# Patient Record
Sex: Female | Born: 2005 | Race: White | Hispanic: No | Marital: Single
Health system: Southern US, Community
[De-identification: ages and names within clinical notes are randomized; demographics above are authoritative.]

## PROBLEM LIST (undated history)

## (undated) ENCOUNTER — Emergency Department (HOSPITAL_BASED_OUTPATIENT_CLINIC_OR_DEPARTMENT_OTHER): Admission: EM | Payer: Medicaid Other | Source: Home / Self Care

## (undated) DIAGNOSIS — F329 Major depressive disorder, single episode, unspecified: Secondary | ICD-10-CM

## (undated) DIAGNOSIS — Q249 Congenital malformation of heart, unspecified: Secondary | ICD-10-CM

## (undated) DIAGNOSIS — F32A Depression, unspecified: Secondary | ICD-10-CM

## (undated) DIAGNOSIS — F419 Anxiety disorder, unspecified: Secondary | ICD-10-CM

## (undated) DIAGNOSIS — G43909 Migraine, unspecified, not intractable, without status migrainosus: Secondary | ICD-10-CM

## (undated) HISTORY — PX: CARDIAC SURGERY: SHX584

## (undated) HISTORY — PX: BREAST SURGERY: SHX581

---
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
## 2023-01-27 ENCOUNTER — Encounter (HOSPITAL_BASED_OUTPATIENT_CLINIC_OR_DEPARTMENT_OTHER): Payer: Self-pay | Admitting: Emergency Medicine

## 2023-01-27 ENCOUNTER — Emergency Department (HOSPITAL_BASED_OUTPATIENT_CLINIC_OR_DEPARTMENT_OTHER): Payer: Medicaid Other

## 2023-01-27 ENCOUNTER — Emergency Department (HOSPITAL_BASED_OUTPATIENT_CLINIC_OR_DEPARTMENT_OTHER)
Admission: EM | Admit: 2023-01-27 | Discharge: 2023-01-27 | Disposition: A | Discharge status: Home / Self Care | Payer: Medicaid Other | Attending: Emergency Medicine | Admitting: Emergency Medicine

## 2023-01-27 DIAGNOSIS — R7309 Other abnormal glucose: Secondary | ICD-10-CM | POA: Diagnosis not present

## 2023-01-27 DIAGNOSIS — R519 Headache, unspecified: Secondary | ICD-10-CM | POA: Diagnosis present

## 2023-01-27 DIAGNOSIS — R112 Nausea with vomiting, unspecified: Secondary | ICD-10-CM | POA: Insufficient documentation

## 2023-01-27 LAB — COMPREHENSIVE METABOLIC PANEL
ALT: 24 U/L (ref 0–44)
AST: 35 U/L (ref 15–41)
Albumin: 4.4 g/dL (ref 3.5–5.0)
Alkaline Phosphatase: 80 U/L (ref 38–126)
Anion gap: 11 (ref 5–15)
BUN: 8 mg/dL (ref 6–20)
CO2: 24 mmol/L (ref 22–32)
Calcium: 9.2 mg/dL (ref 8.9–10.3)
Chloride: 103 mmol/L (ref 98–111)
Creatinine, Ser: 0.66 mg/dL (ref 0.44–1.00)
GFR, Estimated: >60 mL/min (ref >60)
Glucose, Bld: 97 mg/dL (ref 70–99)
Potassium: 3.2 mmol/L — ABNORMAL LOW (ref 3.5–5.1)
Sodium: 138 mmol/L (ref 135–145)
Total Bilirubin: 0.6 mg/dL (ref 0.3–1.2)
Total Protein: 7.7 g/dL (ref 6.5–8.1)

## 2023-01-27 LAB — CBC WITH DIFFERENTIAL/PLATELET
Abs Immature Granulocytes: 0.03 10*3/uL (ref 0.00–0.07)
Basophils Absolute: 0 10*3/uL (ref 0.0–0.1)
Basophils Relative: 0 %
Eosinophils Absolute: 0.1 10*3/uL (ref 0.0–0.5)
Eosinophils Relative: 1 %
HCT: 36.4 % (ref 36.0–46.0)
Hemoglobin: 12.2 g/dL (ref 12.0–15.0)
Immature Granulocytes: 0 %
Lymphocytes Relative: 20 %
Lymphs Abs: 2.1 10*3/uL (ref 0.7–4.0)
MCH: 27.6 pg (ref 26.0–34.0)
MCHC: 33.5 g/dL (ref 30.0–36.0)
MCV: 82.4 fL (ref 80.0–100.0)
Monocytes Absolute: 0.5 10*3/uL (ref 0.1–1.0)
Monocytes Relative: 5 %
Neutro Abs: 7.6 10*3/uL (ref 1.7–7.7)
Neutrophils Relative %: 74 %
Platelets: 341 10*3/uL (ref 150–400)
RBC: 4.42 MIL/uL (ref 3.87–5.11)
RDW: 12.9 % (ref 11.5–15.5)
WBC: 10.3 10*3/uL (ref 4.0–10.5)
nRBC: 0 % (ref 0.0–0.2)

## 2023-01-27 LAB — CBG MONITORING, ED: Glucose-Capillary: 85 mg/dL (ref 70–99)

## 2023-01-27 LAB — PREGNANCY, URINE: Preg Test, Ur: NEGATIVE

## 2023-01-27 MED ORDER — KETOROLAC TROMETHAMINE 15 MG/ML IJ SOLN
15.0000 mg | Freq: Once | INTRAMUSCULAR | Status: AC
  Administered 2023-01-27: 15 mg via INTRAVENOUS
  Filled 2023-01-27: qty 1

## 2023-01-27 MED ORDER — POTASSIUM CHLORIDE CRYS ER 20 MEQ PO TBCR
40.0000 meq | EXTENDED_RELEASE_TABLET | Freq: Once | ORAL | Status: AC
  Administered 2023-01-27: 40 meq via ORAL
  Filled 2023-01-27: qty 2

## 2023-01-27 MED ORDER — PROCHLORPERAZINE EDISYLATE 10 MG/2ML IJ SOLN
10.0000 mg | Freq: Once | INTRAMUSCULAR | Status: AC
  Administered 2023-01-27: 10 mg via INTRAVENOUS
  Filled 2023-01-27: qty 2

## 2023-01-27 MED ORDER — DIPHENHYDRAMINE HCL 50 MG/ML IJ SOLN
25.0000 mg | Freq: Once | INTRAMUSCULAR | Status: AC
  Administered 2023-01-27: 25 mg via INTRAVENOUS
  Filled 2023-01-27: qty 1

## 2023-01-27 MED ORDER — DEXAMETHASONE SODIUM PHOSPHATE 10 MG/ML IJ SOLN
10.0000 mg | Freq: Once | INTRAMUSCULAR | Status: AC
  Administered 2023-01-27: 10 mg via INTRAVENOUS
  Filled 2023-01-27: qty 1

## 2023-01-27 NOTE — Discharge Instructions (Addendum)
Your workup in the ER today was reassuring for acute findings. YOUR CT SCAN WAS NORMAL Laboratory evaluation and CT imaging did not reveal any emergent concerns.  Given that you are feeling better after medications, no additional evaluation is indicated at this time.  However, given that you have had persistent headaches for the last few years, I have given you a referral to neurology with a number to call to schedule appointment for follow-up evaluation and management of these headaches.  Please call at your earliest convenience to schedule an appointment.  Please see your primary doctor as well.  Return if development of any new or worsening symptoms.

## 2023-01-27 NOTE — ED Triage Notes (Signed)
Pt reports HA since Thurs, not responding to OTC meds; now c/o lightheadedness, vomited x 1 today; took Excedrin around 1200

## 2023-01-27 NOTE — ED Provider Notes (Signed)
[place] [facility] Provider Note   CSN: [id number] Arrival date & time: 01/27/23  1451     History {Add pertinent medical, surgical, social history, OB history to HPI:1} Chief Complaint  Patient presents with   Headache    Rachel Flowers is a 17 y.o. female.  Patient with history of anxiety, depression, and migraines presents today with complaints of headache.  She states that same began gradually throughout the day 2 days ago and has been persistent since.  She states that she has tried Excedrin without any relief.  She notes that she gets several migraines a month at baseline, however they always resolved with medications.  She states that this headache is right-sided and in the frontal area and her normal headaches are usually bilateral.  She endorses photophobia without phonophobia.  Has had a few episodes of nausea and vomiting.  She has never seen anyone for headaches or been formally evaluated or had imaging of her head.  Denies fevers, chills, neck pain, shortness of breath, abdominal pain.  The history is provided by the patient. No language interpreter was used.  Headache      Home Medications Prior to Admission medications   Medication Sig Start Date End Date Taking? Authorizing Provider  buPROPion (WELLBUTRIN) 100 MG tablet Take 300 mg by mouth 2 (two) times daily.    [provider]  busPIRone (BUSPAR) 15 MG tablet Take 15 mg by mouth 2 (two) times daily.    [provider]  ibuprofen (ADVIL,MOTRIN) 400 MG tablet Take 1 tablet (400 mg total) by mouth every 6 (six) hours as needed. 05/28/17   Mathews Robinsons B, PA-C  imipramine (TOFRANIL) 10 MG tablet Take 10 mg by mouth at bedtime.    [provider]  rizatriptan (MAXALT) 10 MG tablet Take 10 mg by mouth as needed for migraine. May repeat in 2 hours if needed    [provider]  sertraline (ZOLOFT) 100 MG tablet Take 100 mg by mouth at bedtime.     [provider]  topiramate (TOPAMAX) 25 MG capsule Take 25 mg by mouth daily.    [provider]  zolpidem (AMBIEN) 10 MG tablet Take 10 mg by mouth at bedtime as needed for sleep.    [provider]      Allergies    Trazodone and nefazodone    Review of Systems   Review of Systems  Neurological:  Positive for headaches.  All other systems reviewed and are negative.   Physical Exam Updated Vital Signs BP 122/65 (BP Location: Right Arm)   Pulse 83   Temp 97.9 F (36.6 C) (Oral)   Resp 18   Ht 5\' 4"  (1.626 m)   Wt 78.5 kg   LMP 01/17/2023   SpO2 100%   BMI 29.70 kg/m  Physical Exam Vitals and nursing note reviewed.  Constitutional:      General: She is not in acute distress.    Appearance: Normal appearance. She is normal weight. She is not ill-appearing, toxic-appearing or diaphoretic.  HENT:     Head: Normocephalic and atraumatic.  Eyes:     Extraocular Movements: Extraocular movements intact.     Pupils: Pupils are equal, round, and reactive to light.  Neck:     Comments: No meningismus Cardiovascular:     Rate and Rhythm: Normal rate.  Pulmonary:     Effort: Pulmonary effort is normal. No respiratory distress.  Musculoskeletal:  General: Normal range of motion.     Cervical back: Normal range of motion.  Skin:    General: Skin is warm and dry.  Neurological:     General: No focal deficit present.     Mental Status: She is alert and oriented to person, place, and time.     GCS: GCS eye subscore is 4. GCS verbal subscore is 5. GCS motor subscore is 6.     Sensory: Sensation is intact.     Motor: Motor function is intact.     Coordination: Coordination is intact.     Gait: Gait is intact.     Comments: Alert and oriented to self, place, time and event.    Speech is fluent, clear without dysarthria or dysphasia.    Strength 5/5 in upper/lower extremities   Sensation intact in upper/lower extremities    CN I not tested   CN II grossly intact visual fields bilaterally. Did not visualize posterior eye.  CN III, IV, VI PERRLA and EOMs intact bilaterally  CN V Intact sensation to sharp and light touch to the face  CN VII facial movements symmetric  CN VIII not tested  CN IX, X no uvula deviation, symmetric rise of soft palate  CN XI 5/5 SCM and trapezius strength bilaterally  CN XII Midline tongue protrusion, symmetric L/R movements   Psychiatric:        Mood and Affect: Mood normal.        Behavior: Behavior normal.     ED Results / Procedures / Treatments   Labs (all labs ordered are listed, but only abnormal results are displayed) Labs Reviewed  CBC WITH DIFFERENTIAL/PLATELET  PREGNANCY, URINE  COMPREHENSIVE METABOLIC PANEL  CBG MONITORING, ED  CBG MONITORING, ED    EKG None  Radiology No results found.  Procedures Procedures  {Document cardiac monitor, telemetry assessment procedure when appropriate:1}  Medications Ordered in ED Medications  prochlorperazine (COMPAZINE) injection 10 mg (has no administration in time range)  diphenhydrAMINE (BENADRYL) injection 25 mg (has no administration in time range)  dexamethasone (DECADRON) injection 10 mg (has no administration in time range)  ketorolac (TORADOL) 15 MG/ML injection 15 mg (has no administration in time range)    ED Course/ Medical Decision Making/ A&P   {   Click here for ABCD2, HEART and other calculatorsREFRESH Note before signing :1}                              Medical Decision Making Amount and/or Complexity of Data Reviewed Labs: ordered. Radiology: ordered.  Risk Prescription drug management.   This patient is a 17 y.o. female who presents to the ED for concern of headache, this involves an extensive number of treatment options, and is a complaint that carries with it a high risk of complications and morbidity. The emergent differential diagnosis prior to evaluation includes, but is not limited to,  subarachnoid  hemorrhage, meningitis, cerebral ischemia, carotid/vertebral dissection, intracranial tumor, Venous sinus thrombosis, carbon monoxide poisoning, acute or chronic subdural hemorrhage.  Other considerations include: Migraine, Cluster headache, Hypertension, Caffeine, alcohol, or drug withdrawal, Pseudotumor cerebri, Arteriovenous malformation, Head injury, Preeclampsia, Tension headache, Sinusitis, TMJ . This is not an exhaustive differential.   Past Medical History / Co-morbidities / Social History:  has a past medical history of Anxiety, Depression, Heart defect, congenital (2005), and Migraines.  Additional history: Chart reviewed. 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  Physical Exam: Physical exam performed. The pertinent findings include: Well-appearing, alert and oriented and neurologically intact without deficits  Lab Tests: I ordered, and personally interpreted labs.  The pertinent results include: K 3.2, no other acute laboratory abnormalities.   Imaging Studies: I ordered imaging studies including CT head.  This study is pending at shift change   Medications: I ordered medication including Compazine, Benadryl, Decadron, Toradol, oral potassium for headache, hypokalemia. Reevaluation of the patient after these medicines showed that the patient resolved. I have reviewed the patients home medicines and have made adjustments as needed.   Disposition:  Patient's head CT is pending at shift change and will determine dispo.  If normal, suspect she can be discharged.  I have reassessed the patient after receiving her headache cocktail and she states that her pain is completely gone.  Given that she has continued to have several headaches a month for the last 2 years, will give referral to neurology with a number to call to schedule an  appointment for follow-up.  Discussed same with patient who expressed understanding and is in agreement with this.  Care handoff to Arthor Captain, PA-C at shift change. Please see their note for further evaluation and dispo  Final Clinical Impression(s) / ED Diagnoses Final diagnoses:  None    Rx / DC Orders ED Discharge Orders     None

## 2023-05-20 ENCOUNTER — Other Ambulatory Visit: Payer: Self-pay

## 2023-05-20 ENCOUNTER — Emergency Department (HOSPITAL_BASED_OUTPATIENT_CLINIC_OR_DEPARTMENT_OTHER)
Admission: EM | Admit: 2023-05-20 | Discharge: 2023-05-20 | Disposition: A | Discharge status: Home / Self Care | Payer: Medicaid Other | Attending: Emergency Medicine | Admitting: Emergency Medicine

## 2023-05-20 ENCOUNTER — Encounter (HOSPITAL_BASED_OUTPATIENT_CLINIC_OR_DEPARTMENT_OTHER): Payer: Self-pay | Admitting: Emergency Medicine

## 2023-05-20 DIAGNOSIS — N898 Other specified noninflammatory disorders of vagina: Secondary | ICD-10-CM | POA: Insufficient documentation

## 2023-05-20 DIAGNOSIS — R300 Dysuria: Secondary | ICD-10-CM | POA: Insufficient documentation

## 2023-05-20 DIAGNOSIS — R1030 Lower abdominal pain, unspecified: Secondary | ICD-10-CM

## 2023-05-20 DIAGNOSIS — R197 Diarrhea, unspecified: Secondary | ICD-10-CM | POA: Diagnosis not present

## 2023-05-20 DIAGNOSIS — R1031 Right lower quadrant pain: Secondary | ICD-10-CM | POA: Insufficient documentation

## 2023-05-20 DIAGNOSIS — R1110 Vomiting, unspecified: Secondary | ICD-10-CM | POA: Diagnosis not present

## 2023-05-20 LAB — COMPREHENSIVE METABOLIC PANEL
ALT: 18 U/L (ref 0–44)
AST: 19 U/L (ref 15–41)
Albumin: 5 g/dL (ref 3.5–5.0)
Alkaline Phosphatase: 86 U/L (ref 38–126)
Anion gap: 11 (ref 5–15)
BUN: 9 mg/dL (ref 6–20)
CO2: 24 mmol/L (ref 22–32)
Calcium: 9.5 mg/dL (ref 8.9–10.3)
Chloride: 105 mmol/L (ref 98–111)
Creatinine, Ser: 0.58 mg/dL (ref 0.44–1.00)
GFR, Estimated: >60 mL/min (ref >60)
Glucose, Bld: 91 mg/dL (ref 70–99)
Potassium: 3.5 mmol/L (ref 3.5–5.1)
Sodium: 140 mmol/L (ref 135–145)
Total Bilirubin: 0.8 mg/dL (ref 0.0–1.2)
Total Protein: 8.5 g/dL — ABNORMAL HIGH (ref 6.5–8.1)

## 2023-05-20 LAB — URINALYSIS, MICROSCOPIC (REFLEX)

## 2023-05-20 LAB — CBC WITH DIFFERENTIAL/PLATELET
Abs Immature Granulocytes: 0.02 10*3/uL (ref 0.00–0.07)
Basophils Absolute: 0 10*3/uL (ref 0.0–0.1)
Basophils Relative: 0 %
Eosinophils Absolute: 0.1 10*3/uL (ref 0.0–0.5)
Eosinophils Relative: 1 %
HCT: 41.4 % (ref 36.0–46.0)
Hemoglobin: 13.8 g/dL (ref 12.0–15.0)
Immature Granulocytes: 0 %
Lymphocytes Relative: 26 %
Lymphs Abs: 2.4 10*3/uL (ref 0.7–4.0)
MCH: 27.9 pg (ref 26.0–34.0)
MCHC: 33.3 g/dL (ref 30.0–36.0)
MCV: 83.8 fL (ref 80.0–100.0)
Monocytes Absolute: 0.6 10*3/uL (ref 0.1–1.0)
Monocytes Relative: 6 %
Neutro Abs: 6.2 10*3/uL (ref 1.7–7.7)
Neutrophils Relative %: 67 %
Platelets: 361 10*3/uL (ref 150–400)
RBC: 4.94 MIL/uL (ref 3.87–5.11)
RDW: 12.9 % (ref 11.5–15.5)
WBC: 9.4 10*3/uL (ref 4.0–10.5)
nRBC: 0 % (ref 0.0–0.2)

## 2023-05-20 LAB — URINALYSIS, ROUTINE W REFLEX MICROSCOPIC
Bilirubin Urine: NEGATIVE
Glucose, UA: NEGATIVE mg/dL
Ketones, ur: NEGATIVE mg/dL
Leukocytes,Ua: NEGATIVE
Nitrite: NEGATIVE
Protein, ur: NEGATIVE mg/dL
Specific Gravity, Urine: 1.025 (ref 1.005–1.030)
pH: 6.5 (ref 5.0–8.0)

## 2023-05-20 LAB — WET PREP, GENITAL
Clue Cells Wet Prep HPF POC: NONE SEEN
Sperm: NONE SEEN
Trich, Wet Prep: NONE SEEN
WBC, Wet Prep HPF POC: >=10 — AB (ref <10)
Yeast Wet Prep HPF POC: NONE SEEN

## 2023-05-20 LAB — PREGNANCY, URINE: Preg Test, Ur: NEGATIVE

## 2023-05-20 LAB — LIPASE, BLOOD: Lipase: 25 U/L (ref 11–51)

## 2023-05-20 MED ORDER — IBUPROFEN 400 MG PO TABS
600.0000 mg | ORAL_TABLET | Freq: Once | ORAL | Status: AC
  Administered 2023-05-20: 600 mg via ORAL
  Filled 2023-05-20: qty 1

## 2023-05-20 NOTE — ED Provider Notes (Signed)
[place] [facility] AT [facility] Provider Note   CSN: [id number] Arrival date & time: 05/20/23  1935     History  Chief Complaint  Patient presents with   Dysuria    Kodi Guerrera is a 18 y.o. female.  HPI 18 year old female presents with dysuria and abdominal pain.  Originally her symptoms started about 6 days ago.  She has been having right lower quadrant pain and pain in her right side.  She states she went to [facility] 4 days ago and had a negative CT scan where she was told she had something that mimicked appendicitis but her appendix was okay and she had a viral illness.  She states the abdominal pain is actually getting better.  However she is having more diffuse lower abdominal/pelvic pain and continues to have dysuria.  She was on her menstrual cycle and so it seems like her urine was contaminated at [facility] and they did not give her antibiotics.  Her dysuria is primarily at the end of urination.  She is having some vaginal discharge.  No fevers.  Has been having some vomiting and diarrhea but this has slowed down.  Home Medications Prior to Admission medications   Medication Sig Start Date End Date Taking? Authorizing Provider  buPROPion (WELLBUTRIN) 100 MG tablet Take 300 mg by mouth 2 (two) times daily.    [provider]  busPIRone (BUSPAR) 15 MG tablet Take 15 mg by mouth 2 (two) times daily.    [provider]  ibuprofen (ADVIL,MOTRIN) 400 MG tablet Take 1 tablet (400 mg total) by mouth every 6 (six) hours as needed. 05/28/17   Mathews Robinsons B, PA-C  imipramine (TOFRANIL) 10 MG tablet Take 10 mg by mouth at bedtime.    [provider]  rizatriptan (MAXALT) 10 MG tablet Take 10 mg by mouth as needed for migraine. May repeat in 2 hours if needed    [provider]  sertraline (ZOLOFT) 100 MG tablet Take 100 mg by mouth at bedtime.    [provider]  topiramate (TOPAMAX) 25 MG capsule Take  25 mg by mouth daily.    [provider]  zolpidem (AMBIEN) 10 MG tablet Take 10 mg by mouth at bedtime as needed for sleep.    [provider]      Allergies    Trazodone and nefazodone    Review of Systems   Review of Systems  Physical Exam Updated Vital Signs BP 124/75   Pulse 90   Temp 98.5 F (36.9 C)   Resp 18   Ht 5\' 4"  (1.626 m)   LMP 05/14/2023   SpO2 99%   BMI 29.70 kg/m  Physical Exam  ED Results / Procedures / Treatments   Labs (all labs ordered are listed, but only abnormal results are displayed) Labs Reviewed  WET PREP, GENITAL - Abnormal; Notable for the following components:      Result Value   WBC, Wet Prep HPF POC >=10 (*)    All other components within normal limits  URINALYSIS, ROUTINE W REFLEX MICROSCOPIC - Abnormal; Notable for the following components:   Hgb urine dipstick LARGE (*)    All other components within normal limits  COMPREHENSIVE METABOLIC PANEL - Abnormal; Notable for the following components:   Total Protein 8.5 (*)    All other components within normal limits  URINALYSIS, MICROSCOPIC (REFLEX) - Abnormal; Notable for the following components:   Bacteria, UA FEW (*)  All other components within normal limits  PREGNANCY, URINE  CBC WITH DIFFERENTIAL/PLATELET  LIPASE, BLOOD  RPR  HIV ANTIBODY (ROUTINE TESTING W REFLEX)  GC/CHLAMYDIA PROBE AMP ([place]) NOT AT [facility]    EKG None  Radiology No results found.  Procedures Procedures    Medications Ordered in ED Medications  ibuprofen (ADVIL) tablet 600 mg (600 mg Oral Given 05/20/23 2324)    ED Course/ Medical Decision Making/ A&P                                 Medical Decision Making Amount and/or Complexity of Data Reviewed Labs: ordered.    Details: Normal WBC.  UA with blood but no UTI. Radiology: ordered.   Patient presents with abdominal pain.  Is much more pelvic though pelvic exam performed by the PA (the patient preferred a female  provider) showed no cervical motion tenderness, discharge, etc.  Patient has low concern for STI.  I do not think this is cervicitis or PID.  Will get an ultrasound but this is not available tonight so we will get it tomorrow.  However my suspicion of torsion in this presentation is quite low.  This has been ongoing for over a week.  Eventually I was able to get the [facility] CT report and it appears that she had mesenteric adenitis.  Discussed that this is a diagnoses that require supportive care with ibuprofen.  At this time, her symptoms seem to be overall improving and have been ongoing for a while and with unremarkable workup, I really do not think a repeat CT would be beneficial.  Discussed with patient who agrees.  I doubt appendicitis or other intra-abdominal emergency.  Doubt torsion.  Will discharge home and recommend follow-up with OB/GYN and PCP.  She would like to come back for an ultrasound tomorrow and so I have ordered this.        Final Clinical Impression(s) / ED Diagnoses Final diagnoses:  Lower abdominal pain    Rx / DC Orders ED Discharge Orders          Ordered    US PELVIC COMPLETE W TRANSVAGINAL AND TORSION R/O        05/20/23 2313              Pricilla Loveless, MD 05/20/23 2335

## 2023-05-20 NOTE — ED Triage Notes (Signed)
Pt with dysuria since Thurs; now with RLQ and RT rib area pain

## 2023-05-20 NOTE — ED Notes (Signed)
Pt c/o dysuria mainly after she voids.  Also c/o RLQ pain and rt. Rib pain Pt was seen at [facility] on Thursday and says she had a CT scan which was negative

## 2023-05-20 NOTE — ED Provider Notes (Signed)
Patient requested female examiner for her pelvic exam.  Physical Exam Exam conducted with a chaperone present.  Genitourinary:    Comments: Nursing tech present for duration of exam  Mons pubis and labia without lesions, erythema, edema Vaginal canal and cervix healthy appearing.  Cervix not friable, no cervical motion tenderness Mild clear vaginal discharge. No odor to discharge       Arabella Merles, Cordelia Poche 05/20/23 2241    Pricilla Loveless, MD 05/21/23 [phone]

## 2023-05-20 NOTE — ED Notes (Addendum)
Pelvic performed by EDP pt tolerated well.

## 2023-05-20 NOTE — Discharge Instructions (Signed)
Return here to get your ultrasound tomorrow morning.  Otherwise follow-up with your primary care physician and/or your OB/GYN.  If you develop worsening, continued, or recurrent abdominal pain, uncontrolled vomiting, fever, chest or back pain, or any other new/concerning symptoms then return to the ER for evaluation.

## 2023-05-21 ENCOUNTER — Other Ambulatory Visit (HOSPITAL_BASED_OUTPATIENT_CLINIC_OR_DEPARTMENT_OTHER): Payer: Self-pay | Admitting: Emergency Medicine

## 2023-05-21 ENCOUNTER — Ambulatory Visit (HOSPITAL_BASED_OUTPATIENT_CLINIC_OR_DEPARTMENT_OTHER)
Admission: RE | Admit: 2023-05-21 | Discharge: 2023-05-21 | Disposition: A | Discharge status: Home / Self Care | Payer: Medicaid Other | Source: Ambulatory Visit | Attending: Emergency Medicine | Admitting: Emergency Medicine

## 2023-05-21 DIAGNOSIS — R1030 Lower abdominal pain, unspecified: Secondary | ICD-10-CM

## 2023-05-21 LAB — HIV ANTIBODY (ROUTINE TESTING W REFLEX): HIV Screen 4th Generation wRfx: NONREACTIVE

## 2023-05-21 LAB — RPR: RPR Ser Ql: NONREACTIVE

## 2023-05-21 NOTE — ED Provider Notes (Signed)
Patient returns today for ultrasound to rule out ovarian torsion.  Ultrasound negative for ovarian abnormalities.  Patient was informed of results.  She was encouraged to follow-up with her primary care provider.   Michelle Piper, PA-C 05/21/23 1428    Maia Plan, MD 05/25/23 [phone]

## 2023-05-22 LAB — GC/CHLAMYDIA PROBE AMP (~~LOC~~) NOT AT ARMC
Chlamydia: NEGATIVE
Comment: NEGATIVE
Comment: NORMAL
Neisseria Gonorrhea: NEGATIVE

## 2023-05-27 ENCOUNTER — Encounter (HOSPITAL_COMMUNITY): Payer: Self-pay | Admitting: Emergency Medicine

## 2023-05-27 ENCOUNTER — Other Ambulatory Visit: Payer: Self-pay

## 2023-05-27 ENCOUNTER — Inpatient Hospital Stay (HOSPITAL_COMMUNITY)
Admission: EM | Admit: 2023-05-27 | Discharge: 2023-05-29 | DRG: 087 | Disposition: A | Discharge status: Home / Self Care | Payer: Medicaid Other | Attending: General Surgery | Admitting: General Surgery

## 2023-05-27 ENCOUNTER — Emergency Department (HOSPITAL_COMMUNITY): Payer: Medicaid Other

## 2023-05-27 ENCOUNTER — Inpatient Hospital Stay (HOSPITAL_COMMUNITY): Payer: Medicaid Other

## 2023-05-27 DIAGNOSIS — R402142 Coma scale, eyes open, spontaneous, at arrival to emergency department: Secondary | ICD-10-CM | POA: Diagnosis present

## 2023-05-27 DIAGNOSIS — Z23 Encounter for immunization: Secondary | ICD-10-CM

## 2023-05-27 DIAGNOSIS — Z885 Allergy status to narcotic agent status: Secondary | ICD-10-CM | POA: Diagnosis not present

## 2023-05-27 DIAGNOSIS — R402362 Coma scale, best motor response, obeys commands, at arrival to emergency department: Secondary | ICD-10-CM | POA: Diagnosis present

## 2023-05-27 DIAGNOSIS — F10129 Alcohol abuse with intoxication, unspecified: Secondary | ICD-10-CM | POA: Diagnosis present

## 2023-05-27 DIAGNOSIS — Y906 Blood alcohol level of 120-199 mg/100 ml: Secondary | ICD-10-CM | POA: Diagnosis present

## 2023-05-27 DIAGNOSIS — S065XAA Traumatic subdural hemorrhage with loss of consciousness status unknown, initial encounter: Principal | ICD-10-CM | POA: Diagnosis present

## 2023-05-27 DIAGNOSIS — S065X0A Traumatic subdural hemorrhage without loss of consciousness, initial encounter: Secondary | ICD-10-CM | POA: Diagnosis present

## 2023-05-27 DIAGNOSIS — F419 Anxiety disorder, unspecified: Secondary | ICD-10-CM | POA: Diagnosis present

## 2023-05-27 DIAGNOSIS — S42001A Fracture of unspecified part of right clavicle, initial encounter for closed fracture: Secondary | ICD-10-CM

## 2023-05-27 DIAGNOSIS — R402252 Coma scale, best verbal response, oriented, at arrival to emergency department: Secondary | ICD-10-CM | POA: Diagnosis present

## 2023-05-27 DIAGNOSIS — Y92410 Unspecified street and highway as the place of occurrence of the external cause: Secondary | ICD-10-CM | POA: Diagnosis not present

## 2023-05-27 DIAGNOSIS — Z560 Unemployment, unspecified: Secondary | ICD-10-CM

## 2023-05-27 DIAGNOSIS — S42031A Displaced fracture of lateral end of right clavicle, initial encounter for closed fracture: Secondary | ICD-10-CM | POA: Diagnosis present

## 2023-05-27 LAB — CBC WITH DIFFERENTIAL/PLATELET
Abs Immature Granulocytes: 1.03 10*3/uL — ABNORMAL HIGH (ref 0.00–0.07)
Basophils Absolute: 0.1 10*3/uL (ref 0.0–0.1)
Basophils Relative: 0 %
Eosinophils Absolute: 0.1 10*3/uL (ref 0.0–0.5)
Eosinophils Relative: 0 %
HCT: 41.9 % (ref 36.0–46.0)
Hemoglobin: 13.6 g/dL (ref 12.0–15.0)
Immature Granulocytes: 5 %
Lymphocytes Relative: 17 %
Lymphs Abs: 3.4 10*3/uL (ref 0.7–4.0)
MCH: 28 pg (ref 26.0–34.0)
MCHC: 32.5 g/dL (ref 30.0–36.0)
MCV: 86.2 fL (ref 80.0–100.0)
Monocytes Absolute: 0.9 10*3/uL (ref 0.1–1.0)
Monocytes Relative: 5 %
Neutro Abs: 14.6 10*3/uL — ABNORMAL HIGH (ref 1.7–7.7)
Neutrophils Relative %: 73 %
Platelets: 494 10*3/uL — ABNORMAL HIGH (ref 150–400)
RBC: 4.86 MIL/uL (ref 3.87–5.11)
RDW: 12.6 % (ref 11.5–15.5)
WBC: 20.1 10*3/uL — ABNORMAL HIGH (ref 4.0–10.5)
nRBC: 0 % (ref 0.0–0.2)

## 2023-05-27 LAB — BASIC METABOLIC PANEL
Anion gap: 17 — ABNORMAL HIGH (ref 5–15)
BUN: 10 mg/dL (ref 6–20)
CO2: 16 mmol/L — ABNORMAL LOW (ref 22–32)
Calcium: 8.9 mg/dL (ref 8.9–10.3)
Chloride: 105 mmol/L (ref 98–111)
Creatinine, Ser: 0.75 mg/dL (ref 0.44–1.00)
GFR, Estimated: >60 mL/min (ref >60)
Glucose, Bld: 155 mg/dL — ABNORMAL HIGH (ref 70–99)
Potassium: 2.8 mmol/L — ABNORMAL LOW (ref 3.5–5.1)
Sodium: 138 mmol/L (ref 135–145)

## 2023-05-27 LAB — I-STAT CHEM 8, ED
BUN: 11 mg/dL (ref 6–20)
Calcium, Ion: 1.1 mmol/L — ABNORMAL LOW (ref 1.15–1.40)
Chloride: 107 mmol/L (ref 98–111)
Creatinine, Ser: 0.8 mg/dL (ref 0.44–1.00)
Glucose, Bld: 134 mg/dL — ABNORMAL HIGH (ref 70–99)
HCT: 42 % (ref 36.0–46.0)
Hemoglobin: 14.3 g/dL (ref 12.0–15.0)
Potassium: 3.1 mmol/L — ABNORMAL LOW (ref 3.5–5.1)
Sodium: 143 mmol/L (ref 135–145)
TCO2: 18 mmol/L — ABNORMAL LOW (ref 22–32)

## 2023-05-27 LAB — ETHANOL: Alcohol, Ethyl (B): 171 mg/dL — ABNORMAL HIGH (ref <10)

## 2023-05-27 LAB — HCG, SERUM, QUALITATIVE: Preg, Serum: NEGATIVE

## 2023-05-27 MED ORDER — SODIUM CHLORIDE 0.9 % IV SOLN: INTRAVENOUS | Status: AC

## 2023-05-27 MED ORDER — METOPROLOL TARTRATE 5 MG/5ML IV SOLN: 5.0000 mg | Freq: Four times a day (QID) | INTRAVENOUS | Status: DC | PRN

## 2023-05-27 MED ORDER — OXYCODONE HCL 5 MG PO TABS
10.0000 mg | ORAL_TABLET | ORAL | Status: DC | PRN
  Administered 2023-05-28: 10 mg via ORAL
  Filled 2023-05-27: qty 2

## 2023-05-27 MED ORDER — ONDANSETRON HCL 4 MG/2ML IJ SOLN
4.0000 mg | Freq: Once | INTRAMUSCULAR | Status: AC
  Administered 2023-05-27: 4 mg via INTRAVENOUS
  Filled 2023-05-27: qty 2

## 2023-05-27 MED ORDER — IOHEXOL 350 MG/ML SOLN
75.0000 mL | Freq: Once | INTRAVENOUS | Status: AC | PRN
  Administered 2023-05-27: 75 mL via INTRAVENOUS

## 2023-05-27 MED ORDER — ONDANSETRON HCL 4 MG/2ML IJ SOLN: 4.0000 mg | Freq: Four times a day (QID) | INTRAMUSCULAR | Status: DC | PRN

## 2023-05-27 MED ORDER — POLYETHYLENE GLYCOL 3350 17 G PO PACK: 17.0000 g | PACK | Freq: Every day | ORAL | Status: DC | PRN

## 2023-05-27 MED ORDER — HYDROXYZINE HCL 25 MG PO TABS
25.0000 mg | ORAL_TABLET | Freq: Every day | ORAL | Status: DC | PRN
  Administered 2023-05-28: 25 mg via ORAL
  Filled 2023-05-27: qty 1

## 2023-05-27 MED ORDER — METHOCARBAMOL 1000 MG/10ML IJ SOLN: 500.0000 mg | Freq: Three times a day (TID) | INTRAMUSCULAR | Status: DC

## 2023-05-27 MED ORDER — LEVETIRACETAM IN NACL 500 MG/100ML IV SOLN
500.0000 mg | Freq: Two times a day (BID) | INTRAVENOUS | Status: DC
  Administered 2023-05-27 – 2023-05-28 (×3): 500 mg via INTRAVENOUS
  Filled 2023-05-27 (×3): qty 100

## 2023-05-27 MED ORDER — HYDRALAZINE HCL 20 MG/ML IJ SOLN: 10.0000 mg | INTRAMUSCULAR | Status: DC | PRN

## 2023-05-27 MED ORDER — SODIUM CHLORIDE 0.9 % IV BOLUS
1000.0000 mL | Freq: Once | INTRAVENOUS | Status: AC
  Administered 2023-05-27: 1000 mL via INTRAVENOUS

## 2023-05-27 MED ORDER — FENTANYL CITRATE PF 50 MCG/ML IJ SOSY
PREFILLED_SYRINGE | INTRAMUSCULAR | Status: AC
  Filled 2023-05-27: qty 1

## 2023-05-27 MED ORDER — ACETAMINOPHEN 500 MG PO TABS
1000.0000 mg | ORAL_TABLET | Freq: Four times a day (QID) | ORAL | Status: DC
  Administered 2023-05-27 – 2023-05-29 (×10): 1000 mg via ORAL
  Filled 2023-05-27 (×9): qty 2

## 2023-05-27 MED ORDER — MORPHINE SULFATE (PF) 4 MG/ML IV SOLN
4.0000 mg | INTRAVENOUS | Status: DC | PRN
  Administered 2023-05-27 (×2): 4 mg via INTRAVENOUS
  Filled 2023-05-27 (×2): qty 1

## 2023-05-27 MED ORDER — OXYCODONE HCL 5 MG PO TABS
5.0000 mg | ORAL_TABLET | ORAL | Status: DC | PRN
  Administered 2023-05-28: 5 mg via ORAL
  Filled 2023-05-27: qty 1

## 2023-05-27 MED ORDER — TETANUS-DIPHTH-ACELL PERTUSSIS 5-2.5-18.5 LF-MCG/0.5 IM SUSY
0.5000 mL | PREFILLED_SYRINGE | Freq: Once | INTRAMUSCULAR | Status: AC
  Administered 2023-05-27: 0.5 mL via INTRAMUSCULAR
  Filled 2023-05-27: qty 0.5

## 2023-05-27 MED ORDER — DOCUSATE SODIUM 100 MG PO CAPS
100.0000 mg | ORAL_CAPSULE | Freq: Two times a day (BID) | ORAL | Status: DC
  Administered 2023-05-27 – 2023-05-29 (×3): 100 mg via ORAL
  Filled 2023-05-27 (×3): qty 1

## 2023-05-27 MED ORDER — ONDANSETRON 4 MG PO TBDP
4.0000 mg | ORAL_TABLET | Freq: Four times a day (QID) | ORAL | Status: DC | PRN
  Administered 2023-05-27: 4 mg via ORAL
  Filled 2023-05-27: qty 1

## 2023-05-27 MED ORDER — METHOCARBAMOL 500 MG PO TABS
500.0000 mg | ORAL_TABLET | Freq: Three times a day (TID) | ORAL | Status: DC
  Administered 2023-05-27 – 2023-05-29 (×8): 500 mg via ORAL
  Filled 2023-05-27 (×8): qty 1

## 2023-05-27 MED ORDER — FENTANYL CITRATE PF 50 MCG/ML IJ SOSY
50.0000 ug | PREFILLED_SYRINGE | Freq: Once | INTRAMUSCULAR | Status: AC
Start: 2023-05-27 — End: 2023-05-27
  Administered 2023-05-27: 50 ug via INTRAVENOUS

## 2023-05-27 NOTE — Progress Notes (Signed)
Orthopedic Tech Progress Note Patient Details:  Rachel Flowers 06-Oct-2005 [id number]  Patient ID: Rulon Abide, female   DOB: Dec 21, 2005, 18 y.o.   MRN: [id number] I attended trauma page. Trinna Post 05/27/2023, 1:47 AM
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
## 2023-05-27 NOTE — Progress Notes (Signed)
Arousable but somnolent. CT head stable. Sling in place, ortho plan for outpatient follow up -PT/OT/TBI therapies
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
## 2023-05-27 NOTE — Progress Notes (Signed)
Orthopedic Tech Progress Note Patient Details:  Rachel Flowers April 30, 2005 [id number]  Ortho Devices Type of Ortho Device: Arm sling Ortho Device/Splint Location: rue Ortho Device/Splint Interventions: Ordered, Application, Adjustment   Post Interventions Patient Tolerated: Well Instructions Provided: Care of device, Adjustment of device  Trinna Post 05/27/2023, 5:52 AM
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
## 2023-05-27 NOTE — Progress Notes (Signed)
Inpatient Rehab Admissions Coordinator:   Per therapy recommendations, patient was screened for CIR candidacy by Clemens Catholic, MS, CCC-SLP. At this time, Pt. is not at a level to tolerate the intensity of CIR.  Pt. may have potential to progress to becoming a potential CIR candidate, so CIR admissions team will follow and monitor for progress and participation with therapies and place consult order if Pt. appears to be an appropriate candidate. Please contact me with any questions.   Clemens Catholic, [place], [place] Admissions Coordinator  [phone] ([place]) [phone] (office)
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
## 2023-05-27 NOTE — ED Notes (Signed)
 CCMD notified. Pt is on monitor.
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
## 2023-05-27 NOTE — ED Notes (Signed)
Pt family asking if pt was going to get a 2nd x-ray of her lower back, after speaking with PT and noting the difficulty and pain pt experienced while attempting to move to sitting position.  I spoke with Dr Sheliah Hatch and he stated pt's pain was related to bruising noted on CT scan and there was no need for 2nd x-ray.  Family notified.
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
## 2023-05-27 NOTE — ED Notes (Signed)
Spoke with friend at bedside.  She stated pt had just fallen asleep.

## 2023-05-28 ENCOUNTER — Encounter (HOSPITAL_COMMUNITY): Payer: Self-pay | Admitting: *Deleted

## 2023-05-28 ENCOUNTER — Encounter (HOSPITAL_COMMUNITY): Payer: Self-pay | Admitting: General Surgery

## 2023-05-28 LAB — BASIC METABOLIC PANEL
Anion gap: 7 (ref 5–15)
BUN: 7 mg/dL (ref 6–20)
CO2: 23 mmol/L (ref 22–32)
Calcium: 8.3 mg/dL — ABNORMAL LOW (ref 8.9–10.3)
Chloride: 109 mmol/L (ref 98–111)
Creatinine, Ser: 0.68 mg/dL (ref 0.44–1.00)
GFR, Estimated: >60 mL/min (ref >60)
Glucose, Bld: 86 mg/dL (ref 70–99)
Potassium: 3.7 mmol/L (ref 3.5–5.1)
Sodium: 139 mmol/L (ref 135–145)

## 2023-05-28 LAB — CBC
HCT: 31.5 % — ABNORMAL LOW (ref 36.0–46.0)
Hemoglobin: 10.4 g/dL — ABNORMAL LOW (ref 12.0–15.0)
MCH: 28.3 pg (ref 26.0–34.0)
MCHC: 33 g/dL (ref 30.0–36.0)
MCV: 85.8 fL (ref 80.0–100.0)
Platelets: 261 10*3/uL (ref 150–400)
RBC: 3.67 MIL/uL — ABNORMAL LOW (ref 3.87–5.11)
RDW: 13.2 % (ref 11.5–15.5)
WBC: 8.5 10*3/uL (ref 4.0–10.5)
nRBC: 0 % (ref 0.0–0.2)

## 2023-05-28 LAB — HIV ANTIBODY (ROUTINE TESTING W REFLEX): HIV Screen 4th Generation wRfx: NONREACTIVE

## 2023-05-28 MED ORDER — ORAL CARE MOUTH RINSE: 15.0000 mL | OROMUCOSAL | Status: DC | PRN

## 2023-05-28 MED ORDER — LEVETIRACETAM 500 MG PO TABS
500.0000 mg | ORAL_TABLET | Freq: Two times a day (BID) | ORAL | Status: DC
  Administered 2023-05-28 – 2023-05-29 (×2): 500 mg via ORAL
  Filled 2023-05-28 (×2): qty 1

## 2023-05-28 MED ORDER — PROCHLORPERAZINE EDISYLATE 10 MG/2ML IJ SOLN: 5.0000 mg | INTRAMUSCULAR | Status: DC | PRN

## 2023-05-28 MED ORDER — OXYCODONE HCL 5 MG PO TABS
5.0000 mg | ORAL_TABLET | ORAL | Status: DC | PRN
  Administered 2023-05-28 – 2023-05-29 (×4): 10 mg via ORAL
  Filled 2023-05-28 (×4): qty 2

## 2023-05-28 MED ORDER — LACTATED RINGERS IV BOLUS
1000.0000 mL | Freq: Once | INTRAVENOUS | Status: AC
  Administered 2023-05-28: 1000 mL via INTRAVENOUS

## 2023-05-28 MED ORDER — MORPHINE SULFATE (PF) 2 MG/ML IV SOLN: 2.0000 mg | Freq: Four times a day (QID) | INTRAVENOUS | Status: DC | PRN

## 2023-05-28 NOTE — Plan of Care (Signed)
  Problem: Clinical Measurements: Goal: Ability to maintain clinical measurements within normal limits will improve Outcome: Progressing Goal: Will remain free from infection Outcome: Progressing Goal: Diagnostic test results will improve Outcome: Progressing Goal: Respiratory complications will improve Outcome: Progressing Goal: Cardiovascular complication will be avoided Outcome: Progressing   Problem: Activity: Goal: Risk for activity intolerance will decrease Outcome: Progressing   Problem: Nutrition: Goal: Adequate nutrition will be maintained Outcome: Progressing   Problem: Pain Managment: Goal: General experience of comfort will improve and/or be controlled Outcome: Progressing   Problem: Safety: Goal: Ability to remain free from injury will improve Outcome: Progressing
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
## 2023-05-28 NOTE — Progress Notes (Signed)
Inpatient Rehab Admissions Coordinator:   Pt re-screened for CIR candidacy by Megan Salon, MS, CCC-SLP. At this time, PT feels Pt. Will progress to being able to d/c home with [facility]. I will not pursue CIR consult at this time.Please contact me any with questions.  Megan Salon, MS, CCC-SLP Rehab Admissions Coordinator  [phone] (celll) [phone] (office)
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
## 2023-05-29 ENCOUNTER — Other Ambulatory Visit (HOSPITAL_COMMUNITY): Payer: Self-pay

## 2023-05-29 ENCOUNTER — Encounter (HOSPITAL_COMMUNITY): Payer: Self-pay | Admitting: *Deleted

## 2023-05-29 LAB — BASIC METABOLIC PANEL
Anion gap: 11 (ref 5–15)
BUN: 5 mg/dL — ABNORMAL LOW (ref 6–20)
CO2: 22 mmol/L (ref 22–32)
Calcium: 8.6 mg/dL — ABNORMAL LOW (ref 8.9–10.3)
Chloride: 105 mmol/L (ref 98–111)
Creatinine, Ser: 0.58 mg/dL (ref 0.44–1.00)
GFR, Estimated: >60 mL/min (ref >60)
Glucose, Bld: 72 mg/dL (ref 70–99)
Potassium: 3.6 mmol/L (ref 3.5–5.1)
Sodium: 138 mmol/L (ref 135–145)

## 2023-05-29 LAB — CBC
HCT: 34 % — ABNORMAL LOW (ref 36.0–46.0)
Hemoglobin: 11.2 g/dL — ABNORMAL LOW (ref 12.0–15.0)
MCH: 28.3 pg (ref 26.0–34.0)
MCHC: 32.9 g/dL (ref 30.0–36.0)
MCV: 85.9 fL (ref 80.0–100.0)
Platelets: 289 10*3/uL (ref 150–400)
RBC: 3.96 MIL/uL (ref 3.87–5.11)
RDW: 12.8 % (ref 11.5–15.5)
WBC: 8.1 10*3/uL (ref 4.0–10.5)
nRBC: 0 % (ref 0.0–0.2)

## 2023-05-29 MED ORDER — LEVETIRACETAM 500 MG PO TABS
500.0000 mg | ORAL_TABLET | Freq: Two times a day (BID) | ORAL | 0 refills | Status: AC
  Filled 2023-05-29: qty 12, 6d supply, fill #0

## 2023-05-29 MED ORDER — DOCUSATE SODIUM 100 MG PO CAPS
100.0000 mg | ORAL_CAPSULE | Freq: Two times a day (BID) | ORAL | 0 refills | Status: DC
  Filled 2023-05-29: qty 10, 5d supply, fill #0

## 2023-05-29 MED ORDER — ACETAMINOPHEN 500 MG PO TABS
1000.0000 mg | ORAL_TABLET | Freq: Four times a day (QID) | ORAL | 0 refills | Status: AC
  Filled 2023-05-29: qty 30, 4d supply, fill #0

## 2023-05-29 MED ORDER — METHOCARBAMOL 500 MG PO TABS
500.0000 mg | ORAL_TABLET | Freq: Four times a day (QID) | ORAL | 0 refills | Status: DC | PRN
  Filled 2023-05-29: qty 50, 13d supply, fill #0

## 2023-05-29 MED ORDER — POLYETHYLENE GLYCOL 3350 17 GM/SCOOP PO POWD
17.0000 g | Freq: Every day | ORAL | 0 refills | Status: DC | PRN
  Filled 2023-05-29: qty 238, 14d supply, fill #0

## 2023-05-29 MED ORDER — OXYCODONE HCL 5 MG PO TABS
5.0000 mg | ORAL_TABLET | Freq: Four times a day (QID) | ORAL | 0 refills | Status: DC | PRN
  Filled 2023-05-29: qty 15, 2d supply, fill #0
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
## 2023-05-29 NOTE — Progress Notes (Addendum)
 Order to discharge patient home. Family at bedside. No DME needs at this time. Nathanel SANTA RN here to assist with discharge. TOC medications picked up by Nathanel and delivered to patient prior to discharge. Discharge instructions/AVS given to and reviewed with patient and family. Education provided as needed. Patient verbalized understanding. 2 PIV's removed by Nathanel SANTA RN. Personal belongings sent home with patient. Home via private vehicle.

## 2023-05-29 NOTE — Final Progress Note (Signed)
 Discharge instructions (including medications) discussed with and copy provided to patient/caregiver
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
## 2023-05-29 NOTE — Plan of Care (Signed)

## 2023-05-29 NOTE — Plan of Care (Signed)
  Problem: Acute Rehab PT Goals(only PT should resolve) Goal: Pt will Roll Supine to Side Outcome: Adequate for Discharge Goal: Pt Will Go Supine/Side To Sit Outcome: Adequate for Discharge Goal: Pt Will Go Sit To Supine/Side Outcome: Adequate for Discharge Goal: Patient Will Transfer Sit To/From Stand Outcome: Adequate for Discharge Goal: Pt Will Transfer Bed To Chair/Chair To Bed Outcome: Adequate for Discharge Goal: Pt Will Ambulate Outcome: Adequate for Discharge   Problem: Acute Rehab OT Goals (only OT should resolve) Goal: Pt. Will Perform Grooming Outcome: Adequate for Discharge Goal: Pt. Will Perform Upper Body Dressing Outcome: Adequate for Discharge Goal: Pt. Will Perform Lower Body Dressing Outcome: Adequate for Discharge Goal: Pt. Will Transfer To Toilet Outcome: Adequate for Discharge Goal: Pt/Caregiver Will Perform Home Exercise Program Outcome: Adequate for Discharge
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
## 2023-05-29 NOTE — TOC Transition Note (Signed)
 Transition of Care [facility]) - Discharge Note   Patient Details  Name: Honor Bader MRN: [id number] Date of Birth: 10-21-2005  [facility] CM/SW Contact:  Josepha Mliss HERO, RN Phone Number: 05/29/2023, 2:37 PM   Clinical Narrative:    Patient medically stable for discharge home with grandparents to provide needed assistance.  PT/OT recommending OP therapies, and referrals made to [facility] in [place] for follow up.  Referral to [facility] for [facility], to be delivered to bedside prior to dc.  Confirmed discharge arrangements with patient and grandmother, and they are both in agreement.    Addendum: 2:50pm Patient being transferred to the discharge lounge; Ryan with [employer] made aware to deliver [facility] there.   Final next level of care: OP Rehab Barriers to Discharge: Barriers Resolved   Patient Goals and CMS Choice Patient states their goals for this hospitalization and ongoing recovery are:: to go home                              Discharge Plan and Services Additional resources added to the After Visit Summary for     Discharge Planning Services: CM Consult            DME Arranged: Bedside commode DME Agency: [facility] Date DME Agency Contacted: 05/29/23 Time DME Agency Contacted: [phone] Representative spoke with at DME Agency: Ryan            Social Drivers of Health (SDOH) Interventions SDOH Screenings   Food Insecurity: No Food Insecurity (05/28/2023)  Housing: Low Risk  (05/28/2023)  Recent Concern: Housing - High Risk (05/28/2023)  Transportation Needs: No Transportation Needs (05/28/2023)  Utilities: Not At Risk (05/28/2023)  Tobacco Use: High Risk (05/28/2023)     Readmission Risk Interventions     No data to display         Mliss MICAEL Josepha, RN, BSN  Trauma/Neuro ICU Case Manager [phone]
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
## 2023-06-08 ENCOUNTER — Encounter: Payer: Self-pay | Admitting: Physical Medicine and Rehabilitation
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
## 2023-06-29 ENCOUNTER — Encounter: Payer: Self-pay | Admitting: Occupational Therapy

## 2023-06-29 ENCOUNTER — Ambulatory Visit: Attending: General Surgery | Admitting: Occupational Therapy

## 2023-06-29 ENCOUNTER — Ambulatory Visit

## 2023-06-29 DIAGNOSIS — M5459 Other low back pain: Secondary | ICD-10-CM | POA: Insufficient documentation

## 2023-06-29 DIAGNOSIS — R41844 Frontal lobe and executive function deficit: Secondary | ICD-10-CM | POA: Diagnosis present

## 2023-06-29 DIAGNOSIS — S065XAA Traumatic subdural hemorrhage with loss of consciousness status unknown, initial encounter: Secondary | ICD-10-CM | POA: Diagnosis not present

## 2023-06-29 DIAGNOSIS — R278 Other lack of coordination: Secondary | ICD-10-CM | POA: Insufficient documentation

## 2023-06-29 DIAGNOSIS — M25611 Stiffness of right shoulder, not elsewhere classified: Secondary | ICD-10-CM | POA: Insufficient documentation

## 2023-06-29 DIAGNOSIS — M6281 Muscle weakness (generalized): Secondary | ICD-10-CM | POA: Diagnosis not present

## 2023-06-29 DIAGNOSIS — M25511 Pain in right shoulder: Secondary | ICD-10-CM | POA: Diagnosis not present

## 2023-06-29 DIAGNOSIS — R41840 Attention and concentration deficit: Secondary | ICD-10-CM | POA: Insufficient documentation
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
## 2023-07-02 ENCOUNTER — Ambulatory Visit: Payer: Medicaid Other | Admitting: Occupational Therapy

## 2023-07-02 ENCOUNTER — Ambulatory Visit: Payer: Medicaid Other | Admitting: Physical Therapy

## 2023-07-04 ENCOUNTER — Ambulatory Visit

## 2023-07-04 ENCOUNTER — Ambulatory Visit: Admitting: Occupational Therapy

## 2023-07-04 DIAGNOSIS — M6281 Muscle weakness (generalized): Secondary | ICD-10-CM

## 2023-07-04 DIAGNOSIS — M25611 Stiffness of right shoulder, not elsewhere classified: Secondary | ICD-10-CM | POA: Diagnosis not present

## 2023-07-04 DIAGNOSIS — R41840 Attention and concentration deficit: Secondary | ICD-10-CM

## 2023-07-04 DIAGNOSIS — R278 Other lack of coordination: Secondary | ICD-10-CM
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
## 2023-07-04 NOTE — Patient Instructions (Addendum)
 Memory Compensation Strategies  Use "WARM" strategy.  W= write it down  A= associate it  R= repeat it  M= make a mental note  2.   You can keep a Glass blower/designer.  Use a 3-ring notebook with sections for the following: calendar, important names and phone numbers,  medications, doctors' names/phone numbers, lists/reminders, and a section to journal what you did each day.   3.    Use a calendar to write appointments down.  4.    Write yourself a schedule for the day.  This can be placed on the calendar or in a separate section of the Memory Notebook.  Keeping a regular schedule can help memory.  5.    Use medication organizer with sections for each day or morning/evening pills.  You may need help loading it  6.    Keep a basket, or pegboard by the door.  Place items that you need to take out with you in the basket or on the pegboard.  You may also want to  include a message board for reminders.  7.    Use sticky notes.  Place sticky notes with reminders in a place where the task is performed.  For example: " turn off the  stove" placed by the stove, "lock the door" placed on the door at eye level, " take your medications" on the bathroom mirror or by the place where you normally take your medications.  8.    Use alarms/timers.  Use while cooking to remind yourself to check on food or as a reminder to take your medicine, or as a  reminder to make a call, or as a reminder to perform another task, etc.        Grip Strengthening (Resistive Putty)    Squeeze putty using thumb and all fingers. Repeat __20__ times. Do __2__ sessions per day.  Pinch: Palmar Finger / Thumb Activities: Extension    Roll putty into rope shape using all fingers held straight. Hitchhike with thumb up and out.  Copyright  VHI. All rights reserved.     Pinch putty with right thumb and each fingertip in turn. Repeat __20__ times. Do __2__ sessions per day. Activity: Peel fruit such as lemons or  oranges.* Peel stickers off surfaces.  Copyright  VHI. All rights reserved.

## 2023-07-10 ENCOUNTER — Ambulatory Visit

## 2023-07-11 ENCOUNTER — Encounter
Payer: Medicaid Other | Attending: Physical Medicine and Rehabilitation | Admitting: Physical Medicine and Rehabilitation

## 2023-07-11 VITALS — BP 115/76 | HR 88 | Ht 64.0 in | Wt 172.0 lb

## 2023-07-11 DIAGNOSIS — R269 Unspecified abnormalities of gait and mobility: Secondary | ICD-10-CM | POA: Diagnosis not present

## 2023-07-11 DIAGNOSIS — R519 Headache, unspecified: Secondary | ICD-10-CM | POA: Insufficient documentation

## 2023-07-11 DIAGNOSIS — G4739 Other sleep apnea: Secondary | ICD-10-CM | POA: Insufficient documentation

## 2023-07-11 DIAGNOSIS — R42 Dizziness and giddiness: Secondary | ICD-10-CM | POA: Insufficient documentation

## 2023-07-11 DIAGNOSIS — G4700 Insomnia, unspecified: Secondary | ICD-10-CM | POA: Insufficient documentation

## 2023-07-11 DIAGNOSIS — M545 Low back pain, unspecified: Secondary | ICD-10-CM | POA: Diagnosis present

## 2023-07-11 DIAGNOSIS — S065XAA Traumatic subdural hemorrhage with loss of consciousness status unknown, initial encounter: Secondary | ICD-10-CM | POA: Insufficient documentation

## 2023-07-11 DIAGNOSIS — G8929 Other chronic pain: Secondary | ICD-10-CM | POA: Insufficient documentation

## 2023-07-11 MED ORDER — METHOCARBAMOL 500 MG PO TABS: 500.0000 mg | ORAL_TABLET | Freq: Two times a day (BID) | ORAL | 0 refills | Status: AC | PRN

## 2023-07-11 NOTE — Patient Instructions (Addendum)
 Okay to continue Excedrin 1-2 tabs 3 times weekly as needed for migraine control.  Advised not to use more frequently than this, due to risk of rebound headaches.  For low back pain, continue PT, use over-the-counter Voltaren gel up to 4 times daily for local pain control, and can use Tylenol as needed 1000 mg up to 3 times daily.  I am also giving another short course of Robaxin to help with back pain as you start PT; advise that this can worsen dizziness and fatigue.   I am referring you to PT for vestibular ocular therapy for headaches, trouble with vision and balance.  This will also be at [facility] form, you can integrated into your current therapies.  We did discuss that the priority right now is ensuring that you can pass your GED exams; limit know if you need any medical documentation for extended timelines for this or any accommodations for test taking.  I recommend getting back to studying for these exams, since they are very important for your future.  I am referring you for a sleep evaluation given symptoms of chronic insomnia and apneic episodes at night that wake you up frequently.  Also, general sleep hygiene recommendations as below:  Pick the same time to lay down every night, ideally between 8 and 10 PM.    Starting 1 hour before you want to go to sleep, turn off all television screens, phone screens, tablets, and computers.    Keep the lights low and perform only low stimulation activities, such as reading.    Only use your bedroom for sleep and sex. Avoid daytime naps and limit any time spent in your bed that is not dedicated to sleep.   You may also take 3 to 5 mg of over-the-counter melatonin approximately 1 hour before bedtime, or if you are prescribed a medication for sleep take it at this time.  Avoid alcohol, decongestants/pseudoephedrine, antihistamines, caffeine, and nicotine a few hours before bedtime, as these can reduce your quality of sleep.  Follow-up in 2  months.
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
## 2023-07-13 ENCOUNTER — Encounter

## 2023-07-13 ENCOUNTER — Encounter: Payer: Self-pay | Admitting: Occupational Therapy

## 2023-07-13 ENCOUNTER — Ambulatory Visit: Admitting: Occupational Therapy

## 2023-07-13 DIAGNOSIS — R41844 Frontal lobe and executive function deficit: Secondary | ICD-10-CM

## 2023-07-13 DIAGNOSIS — M25611 Stiffness of right shoulder, not elsewhere classified: Secondary | ICD-10-CM

## 2023-07-13 DIAGNOSIS — R41840 Attention and concentration deficit: Secondary | ICD-10-CM

## 2023-07-13 DIAGNOSIS — R278 Other lack of coordination: Secondary | ICD-10-CM

## 2023-07-13 DIAGNOSIS — M25511 Pain in right shoulder: Secondary | ICD-10-CM

## 2023-07-13 DIAGNOSIS — M6281 Muscle weakness (generalized): Secondary | ICD-10-CM

## 2023-07-13 NOTE — Patient Instructions (Signed)
   Cane Overhead - Standing   With arms straight, hold cane forward at waist. Raise cane.   Hold 3 seconds. Repeat 10 times. Do 2 times per day.    Chest Press    In shoulder width stance hold cane in both hands at chest with shoulder blades back and down, then press arms straight ahead to straighten elbows. Repeat 10 times per set. Do 2x per day   ROM: Abduction - Wand   Holding wand with right hand palm up, push wand directly out to side, leading with other hand palm down, until stretch is felt. Hold 3 seconds.  Don't raise shoulder.  Don't go above shoulder height. Repeat 10 times per set. Do 2 sessions per day.   ROM: Extension - Wand (Standing)   Stand holding wand behind back. Raise arms as far as possible.  PALMS FORWARD Repeat 10 times per set.  Do 2 sessions per day.  Cane Exercise: Extension / Internal Rotation    Stand holding cane behind back with both hands palm-up. Slide cane up spine toward head. Hold 3 seconds. Repeat 10 times. Do 2 sessions per day.   Cane Exercise: External Rotation    Lie with elbows on surface, even with shoulders. Hold cane above chest, palms toward toes. Lower arms back as far as possible. Keep elbows on surface. Hold 3 seconds. Repeat 10 times. Do 2 sessions per day.
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
## 2023-07-16 ENCOUNTER — Ambulatory Visit: Admitting: Occupational Therapy

## 2023-07-16 ENCOUNTER — Ambulatory Visit: Admitting: Physical Therapy

## 2023-07-16 ENCOUNTER — Encounter: Payer: Self-pay | Admitting: Occupational Therapy

## 2023-07-16 ENCOUNTER — Encounter: Payer: Self-pay | Admitting: Physical Therapy

## 2023-07-16 DIAGNOSIS — M5459 Other low back pain: Secondary | ICD-10-CM

## 2023-07-16 DIAGNOSIS — R41840 Attention and concentration deficit: Secondary | ICD-10-CM

## 2023-07-16 DIAGNOSIS — M6281 Muscle weakness (generalized): Secondary | ICD-10-CM

## 2023-07-16 DIAGNOSIS — R41844 Frontal lobe and executive function deficit: Secondary | ICD-10-CM

## 2023-07-16 DIAGNOSIS — M25611 Stiffness of right shoulder, not elsewhere classified: Secondary | ICD-10-CM

## 2023-07-16 DIAGNOSIS — M25511 Pain in right shoulder: Secondary | ICD-10-CM

## 2023-07-16 DIAGNOSIS — R278 Other lack of coordination: Secondary | ICD-10-CM

## 2023-07-16 NOTE — Patient Instructions (Addendum)
 Weight Exercise: Flexion    Lie on back, 1 lb weight in right hand above face. Arm straight as possible, lower weight beyond head. Repeat 10 times. Do 1-2 sessions per day.   http://[email or web address].exer.[email or web address]   Copyright  VHI. All rights reserved.       Lie on left side, 1 lb weight in upper hand on hip. Keeping elbow straight, lift weight over head 10 times. Do 1-2 sessions per day.   http://gt2.exer.us/78    Copyright  VHI. All rights reserved.    Shoulder External Rotators    With left elbow bent 90 and held at side, move hand away from body, keeping elbow at side. Use1 pound weight. Keep head and back straight. Hold 3 seconds. Repeat 10 times. Do 1-2 sessions per day. CAUTION: Move slowly.  Copyright  VHI. All rights reserved.    Chest Press    Lie on back with knees and arms bent, elbows out from sides, palms forward. Inhale, then while exhaling, lift arms toward ceiling. Keep thumbs up. Hold 1 count. Slowly return to starting position. Repeat 10 times per set. Do 1-2x/day.  May be done with 1lb weight.  Copyright  VHI. All rights reserved.
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
## 2023-07-18 ENCOUNTER — Encounter: Payer: Self-pay | Admitting: Occupational Therapy

## 2023-07-18 ENCOUNTER — Ambulatory Visit: Admitting: Occupational Therapy

## 2023-07-18 ENCOUNTER — Ambulatory Visit

## 2023-07-18 DIAGNOSIS — S065XAA Traumatic subdural hemorrhage with loss of consciousness status unknown, initial encounter: Secondary | ICD-10-CM

## 2023-07-18 DIAGNOSIS — M6281 Muscle weakness (generalized): Secondary | ICD-10-CM

## 2023-07-18 DIAGNOSIS — M25611 Stiffness of right shoulder, not elsewhere classified: Secondary | ICD-10-CM

## 2023-07-18 DIAGNOSIS — M5459 Other low back pain: Secondary | ICD-10-CM

## 2023-07-18 DIAGNOSIS — R41844 Frontal lobe and executive function deficit: Secondary | ICD-10-CM

## 2023-07-18 DIAGNOSIS — M25511 Pain in right shoulder: Secondary | ICD-10-CM

## 2023-07-18 DIAGNOSIS — R278 Other lack of coordination: Secondary | ICD-10-CM

## 2023-07-18 DIAGNOSIS — R41840 Attention and concentration deficit: Secondary | ICD-10-CM

## 2023-07-18 NOTE — Patient Instructions (Signed)
Keeping Thinking Skills Sharp: ?1. Jigsaw puzzles ?2. Card/board games ?3. Talking on the phone/social events ?4. [email or web address] ?5. Online games ?6. Word searches/crossword puzzles ?7.  Logic puzzles ?8. Aerobic exercise (stationary bike) ?9. Eating balanced diet (fruits & veggies) ?10. Drink water ?11. Try something new--new recipe, hobby ?12. Crafts ?13. Do a variety of activities that are challenging ?14.  Plan weekly meals and write a grocery list ?
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
## 2023-07-23 ENCOUNTER — Ambulatory Visit: Admitting: Occupational Therapy

## 2023-07-23 ENCOUNTER — Ambulatory Visit: Admitting: Physical Therapy

## 2023-07-25 ENCOUNTER — Ambulatory Visit

## 2023-07-25 ENCOUNTER — Ambulatory Visit: Admitting: Occupational Therapy
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
## 2023-07-30 ENCOUNTER — Ambulatory Visit: Admitting: Occupational Therapy

## 2023-07-30 ENCOUNTER — Encounter: Payer: Self-pay | Admitting: Occupational Therapy

## 2023-07-30 ENCOUNTER — Ambulatory Visit: Attending: General Surgery

## 2023-07-30 DIAGNOSIS — M6281 Muscle weakness (generalized): Secondary | ICD-10-CM | POA: Diagnosis present

## 2023-07-30 DIAGNOSIS — M5459 Other low back pain: Secondary | ICD-10-CM | POA: Insufficient documentation

## 2023-07-30 DIAGNOSIS — R41840 Attention and concentration deficit: Secondary | ICD-10-CM | POA: Insufficient documentation

## 2023-07-30 DIAGNOSIS — R41844 Frontal lobe and executive function deficit: Secondary | ICD-10-CM

## 2023-07-30 DIAGNOSIS — M25611 Stiffness of right shoulder, not elsewhere classified: Secondary | ICD-10-CM | POA: Diagnosis not present

## 2023-07-30 DIAGNOSIS — R278 Other lack of coordination: Secondary | ICD-10-CM | POA: Diagnosis present

## 2023-07-30 DIAGNOSIS — S065XAA Traumatic subdural hemorrhage with loss of consciousness status unknown, initial encounter: Secondary | ICD-10-CM | POA: Diagnosis not present
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
## 2023-07-30 NOTE — Patient Instructions (Signed)
  Strengthening: Resisted Flexion   Hold tubing with __one ___ arm(s) at side. Pull forward and up. Move shoulder through pain-free range of motion. Repeat __10__ times per set.  Do _1-2_ sessions per day , every other day   Strengthening: Resisted Extension   Hold tubing in _one____ hand(s), arm forward. Pull arm back, elbow straight. Repeat _10___ times per set. Do _1-2___ sessions per day, every other day.   Resisted Horizontal Abduction: Bilateral   Keep arms lower than chest height, Sit or stand, tubing in both hands, arms out in front. Keeping arms straight, pinch shoulder blades together and stretch arms out. Repeat _10___ times per set. Do _1-2___ sessions per day, every other day.   Elbow Flexion: Resisted   With tubing held in __one____ hand(s) and other end secured under foot, curl arm up as far as possible. Repeat _10___ times per set. Do _1-2___ sessions per day, every other day.    Elbow Extension: Resisted   Sit in chair with resistive band secured at armrest (or hold with other hand) and ___one____ elbow bent. Straighten elbow. Repeat _10___ times per set.  Do _1-2___ sessions per day, every other day.   Copyright  [employer]. All rights reserved.
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
## 2023-08-01 ENCOUNTER — Ambulatory Visit: Admitting: Occupational Therapy

## 2023-08-01 ENCOUNTER — Ambulatory Visit

## 2023-08-01 DIAGNOSIS — R41840 Attention and concentration deficit: Secondary | ICD-10-CM

## 2023-08-01 DIAGNOSIS — M6281 Muscle weakness (generalized): Secondary | ICD-10-CM | POA: Diagnosis not present

## 2023-08-01 DIAGNOSIS — M25611 Stiffness of right shoulder, not elsewhere classified: Secondary | ICD-10-CM

## 2023-08-01 DIAGNOSIS — R41844 Frontal lobe and executive function deficit: Secondary | ICD-10-CM

## 2023-08-01 DIAGNOSIS — S065XAA Traumatic subdural hemorrhage with loss of consciousness status unknown, initial encounter: Secondary | ICD-10-CM

## 2023-08-01 DIAGNOSIS — R278 Other lack of coordination: Secondary | ICD-10-CM
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
## 2023-08-22 ENCOUNTER — Encounter: Attending: Physical Medicine and Rehabilitation | Admitting: Physical Medicine and Rehabilitation

## 2023-08-22 DIAGNOSIS — R519 Headache, unspecified: Secondary | ICD-10-CM | POA: Insufficient documentation

## 2023-08-22 DIAGNOSIS — S065XAA Traumatic subdural hemorrhage with loss of consciousness status unknown, initial encounter: Secondary | ICD-10-CM | POA: Insufficient documentation

## 2023-08-22 DIAGNOSIS — R269 Unspecified abnormalities of gait and mobility: Secondary | ICD-10-CM | POA: Insufficient documentation

## 2023-08-22 DIAGNOSIS — G4739 Other sleep apnea: Secondary | ICD-10-CM | POA: Insufficient documentation

## 2023-08-22 DIAGNOSIS — M545 Low back pain, unspecified: Secondary | ICD-10-CM | POA: Insufficient documentation

## 2023-08-22 DIAGNOSIS — G4700 Insomnia, unspecified: Secondary | ICD-10-CM | POA: Insufficient documentation

## 2023-08-22 DIAGNOSIS — G8929 Other chronic pain: Secondary | ICD-10-CM | POA: Insufficient documentation

## 2023-08-22 DIAGNOSIS — R42 Dizziness and giddiness: Secondary | ICD-10-CM | POA: Insufficient documentation

## 2023-08-22 NOTE — Progress Notes (Deleted)
   Subjective:    Patient ID: Rachel Flowers, female    DOB: [birth year], 18 y.o.   MRN: [id number]  HPI Pain Inventory Average Pain {NUMBERS; 0-10:5044} Pain Right Now {NUMBERS; 0-10:5044} My pain is {PAIN DESCRIPTION:21022940}  In the last 24 hours, has pain interfered with the following? General activity {NUMBERS; 0-10:5044} Relation with others {NUMBERS; 0-10:5044} Enjoyment of life {NUMBERS; 0-10:5044} What TIME of day is your pain at its worst? {time of day:24191} Sleep (in general) {BHH GOOD/FAIR/POOR:22877}  Pain is worse with: {ACTIVITIES:21022942} Pain improves with: {PAIN IMPROVES WUJW:11914782} Relief from Meds: {NUMBERS; 0-10:5044}  No family history on file. Social History   Socioeconomic History   Marital status: Single    Spouse name: Not on file   Number of children: Not on file   Years of education: Not on file   Highest education level: Not on file  Occupational History   Not on file  Tobacco Use   Smoking status: Every Day   Smokeless tobacco: Never  Vaping Use   Vaping status: Never Used  Substance and Sexual Activity   Alcohol use: No   Drug use: No   Sexual activity: Yes    Birth control/protection: None  Other Topics Concern   Not on file  Social History Narrative   ** Merged History Encounter **       Social Drivers of Health   Financial Resource Strain: Not on file  Food Insecurity: No Food Insecurity (05/28/2023)   Hunger Vital Sign    Worried About Running Out of Food in the Last Year: Never true    Ran Out of Food in the Last Year: Never true  Transportation Needs: No Transportation Needs (05/28/2023)   PRAPARE - Administrator, Civil Service (Medical): No    Lack of Transportation (Non-Medical): No  Physical Activity: Not on file  Stress: Not on file  Social Connections: Unknown (09/03/2021)   [facility] from [facility]   Social Network    Social Network: Not on file   Past Surgical History:  Procedure  Laterality Date   BREAST SURGERY     CARDIAC SURGERY     hole in heart repaired   Past Surgical History:  Procedure Laterality Date   BREAST SURGERY     CARDIAC SURGERY     hole in heart repaired   Past Medical History:  Diagnosis Date   Anxiety    Depression    Heart defect, 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  Opioid Risk Score:   Fall Risk Score:  `1  Depression screen [facility] 2/9     07/11/2023   10:07 AM  Depression screen PHQ 2/9  Decreased Interest 0  Down, Depressed, Hopeless 0  PHQ - 2 Score 0  Altered sleeping 0  Tired, decreased energy 2  Change in appetite 0  Feeling bad or failure about yourself  0  Trouble concentrating 1  Moving slowly or fidgety/restless 0  Suicidal thoughts 0  PHQ-9 Score 3  Difficult doing work/chores Somewhat difficult      Review of Systems     Objective:   Physical Exam        Assessment & Plan:
